# Patient Record
Sex: Female | Born: 1965 | Race: White | Hispanic: No | Marital: Married | Smoking: Never smoker
Health system: Southern US, Community
[De-identification: ages and names within clinical notes are randomized; demographics above are authoritative.]

## PROBLEM LIST (undated history)

## (undated) DIAGNOSIS — I10 Essential (primary) hypertension: Secondary | ICD-10-CM

## (undated) DIAGNOSIS — D219 Benign neoplasm of connective and other soft tissue, unspecified: Secondary | ICD-10-CM

## (undated) DIAGNOSIS — N809 Endometriosis, unspecified: Secondary | ICD-10-CM

## (undated) DIAGNOSIS — R011 Cardiac murmur, unspecified: Secondary | ICD-10-CM

## (undated) DIAGNOSIS — Z9221 Personal history of antineoplastic chemotherapy: Secondary | ICD-10-CM

## (undated) HISTORY — DX: Benign neoplasm of connective and other soft tissue, unspecified: D21.9

## (undated) HISTORY — PX: CARDIAC CATHETERIZATION: SHX172

## (undated) HISTORY — PX: OOPHORECTOMY: SHX86

## (undated) HISTORY — PX: BARIATRIC SURGERY: SHX1103

## (undated) HISTORY — DX: Endometriosis, unspecified: N80.9

## (undated) HISTORY — PX: OTHER SURGICAL HISTORY: SHX169

## (undated) HISTORY — PX: ABDOMINAL HYSTERECTOMY: SHX81

## (undated) HISTORY — PX: KNEE SURGERY: SHX244

## (undated) HISTORY — PX: TUBAL LIGATION: SHX77

---

## 2003-12-09 ENCOUNTER — Ambulatory Visit: Payer: Self-pay

## 2004-01-27 ENCOUNTER — Ambulatory Visit: Payer: Self-pay

## 2006-02-25 DIAGNOSIS — I219 Acute myocardial infarction, unspecified: Secondary | ICD-10-CM

## 2006-02-25 HISTORY — DX: Acute myocardial infarction, unspecified: I21.9

## 2006-03-15 ENCOUNTER — Other Ambulatory Visit: Payer: Self-pay

## 2006-03-15 ENCOUNTER — Inpatient Hospital Stay: Payer: Self-pay | Admitting: Internal Medicine

## 2006-03-16 ENCOUNTER — Other Ambulatory Visit: Payer: Self-pay

## 2006-03-17 ENCOUNTER — Other Ambulatory Visit: Payer: Self-pay

## 2006-03-26 ENCOUNTER — Ambulatory Visit: Payer: Self-pay | Admitting: Internal Medicine

## 2006-03-27 ENCOUNTER — Ambulatory Visit: Payer: Self-pay | Admitting: Internal Medicine

## 2006-04-16 ENCOUNTER — Encounter: Payer: Self-pay | Admitting: Internal Medicine

## 2006-04-26 ENCOUNTER — Encounter: Payer: Self-pay | Admitting: Internal Medicine

## 2006-05-22 ENCOUNTER — Ambulatory Visit: Payer: Self-pay | Admitting: Internal Medicine

## 2006-05-27 ENCOUNTER — Encounter: Payer: Self-pay | Admitting: Internal Medicine

## 2006-06-26 ENCOUNTER — Encounter: Payer: Self-pay | Admitting: Internal Medicine

## 2006-09-05 ENCOUNTER — Ambulatory Visit: Payer: Self-pay | Admitting: Unknown Physician Specialty

## 2006-09-25 ENCOUNTER — Ambulatory Visit: Payer: Self-pay | Admitting: Nurse Practitioner

## 2006-12-12 ENCOUNTER — Ambulatory Visit (HOSPITAL_COMMUNITY): Admission: RE | Admit: 2006-12-12 | Discharge: 2006-12-12 | Payer: Self-pay | Admitting: Orthopedic Surgery

## 2006-12-16 ENCOUNTER — Encounter: Payer: Self-pay | Admitting: Orthopedic Surgery

## 2006-12-27 ENCOUNTER — Encounter: Payer: Self-pay | Admitting: Orthopedic Surgery

## 2007-01-26 ENCOUNTER — Encounter: Payer: Self-pay | Admitting: Orthopedic Surgery

## 2007-04-20 ENCOUNTER — Ambulatory Visit: Payer: Self-pay | Admitting: Nurse Practitioner

## 2010-07-10 NOTE — Op Note (Signed)
NAMEBRANDII, LAKEY                 ACCOUNT NO.:  [id number]   MEDICAL RECORD NO.:  [id number]          PATIENT TYPE:  AMB   LOCATION:  SDS                          FACILITY:  [facility]   PHYSICIAN:  Harvie Junior, M.D.   DATE OF BIRTH:  04/21/1965   DATE OF PROCEDURE:  12/12/2006  DATE OF DISCHARGE:  12/12/2006                               OPERATIVE REPORT   PREOPERATIVE DIAGNOSIS:  Anterior cruciate ligament tear, right knee.   POSTOPERATIVE DIAGNOSES:  1. Anterior cruciate ligament tear, right knee.  2. Lateral meniscal tear.  3. Medial shelf plica.   PROCEDURE:  1. Anterior cruciate ligament reconstruction with central one-third      patella tendon allograft.  2. Partial lateral meniscectomy.  3. Debridement of medial shelf plica.   SURGEON:  Harvie Junior, M.D.   ASSISTANT:  Marshia Ly, P.A.   ANESTHESIA:  General.   BRIEF HISTORY:  Ms. Riesen is a 45 year old female with a long history  of having an ACL tear.  She has been evaluated by an outside physician  and noted to have an ACL tear.  They talked initially about surgical  fixation; but I think because of medical issues and other things, they  ultimately elected to wait.  She was having continued problems with  giving-out of the knee and was concerned about her instability and came  to see [place].  We ultimately felt that reconstruction with allograft was  reasonable, and got her preoperative cardiac clearance.  Did take her  off of Plavix for 10 days; I felt that was reasonable and she was  brought to the operating room for these procedures.   PROCEDURE:  The patient was taken to the operating room.  After adequate  anesthesia was obtained with general anesthetic, the patient was placed  on the operating table.  The right leg was prepped and draped in the  usual sterile fashion.  Following this, routine arthroscopic examination  of the knee revealed that there was an obvious anterior cruciate  ligament tear with  complete disruption of the fibers.  The medial side  was probed at length and felt to be normal.  Medial meniscus, medial  femoral condyle were normal.  Went up in the area of the lateral side,  which showed that posterolaterally she was fine.  A lateral femoral  condyle was normal.  Anterolaterally, there was a flap meniscal tear  which was flapped into the notch, and this was debrided with a straight  biting forceps and a shaver to shave down this area.  Once this was  completed, attention was turned to the notch, where the remnant of ACL  was debrided, followed by notchplasty on the lateral femoral condyle.  Once that was completed, the tibial guide was used and placed into the  old footprint of the ACL, and we made a small incision distomedially and  dissected down to bone.  Following this, a guidewire was advanced  through the guide, into the old footprint of the ACL.  This was  overreamed with a 10-mm reamer.  A rasp was  used on the back portion of  the tunnel.  The over-the-top guide was then used, 7-mm, and a Beath  needle was advanced out at the distolateral femur.  A 25-mm hole was  drilled in this area, and care being taken to make sure there was no  problem where the posterior wall was intact after a footprint was made.  Following this, a notch was made in this hole.  And then following that,  a little piece of bone from the notch was removed and the graft was then  advanced into the knee over the Beath needle and seated well.  Once that  was completed, the femoral side was locked in with a 7 x 20 screw with a  sheath on a tibial side.  An 8 x 20 screw, no sheath, was used to get  excellent fixation on the tibial side.  Prior to locking in the tibial  side, the graft was cycled 20 times with significant pressure on the  graft.  No tendency towards anisometry and no tendency towards blocking  flexion and extension.  Once that was completed, the little, small  portion of the bone  plug which was down in the tibial tunnel was removed  and the wounds were then closed with interrupted suture.  Sterile  compressive dressing was applied, as well as a knee immobilizer.  The  patient was taken to recovery and was noted to be in satisfactory  condition.   ESTIMATED BLOOD LOSS DURING THE PROCEDURE:  Less than 25 mL.      Harvie Junior, M.D.  Electronically Signed     JLG/MEDQ  D:  12/12/2006  T:  12/13/2006  Job:  [id number]

## 2010-12-05 LAB — I-STAT 8, (EC8 V) (CONVERTED LAB)
Acid-Base Excess: 1
Bicarbonate: 23.5
Glucose, Bld: 95
HCT: 40
Hemoglobin: 13.6
Potassium: 3.8
pH, Ven: 7.502 — ABNORMAL HIGH

## 2010-12-05 LAB — DIFFERENTIAL
Basophils Absolute: 0
Basophils Relative: 0
Eosinophils Relative: 3
Lymphs Abs: 1.6
Monocytes Absolute: 0.7
Monocytes Relative: 11
Neutro Abs: 3.5
Neutrophils Relative %: 59

## 2010-12-05 LAB — CBC
HCT: 41.4
Hemoglobin: 13.7
Platelets: 311
RBC: 4.42
WBC: 5.8

## 2010-12-05 LAB — PROTIME-INR: Prothrombin Time: 12.5

## 2010-12-05 LAB — COMPREHENSIVE METABOLIC PANEL
AST: 33
Albumin: 3.8
Alkaline Phosphatase: 61
Calcium: 9.1
Chloride: 104
Sodium: 138
Total Protein: 6.3

## 2011-07-01 ENCOUNTER — Ambulatory Visit: Payer: Self-pay | Admitting: General Practice

## 2011-10-15 ENCOUNTER — Ambulatory Visit: Payer: Self-pay | Admitting: Nurse Practitioner

## 2013-07-24 ENCOUNTER — Emergency Department (HOSPITAL_COMMUNITY)
Admission: EM | Admit: 2013-07-24 | Discharge: 2013-07-24 | Disposition: A | Discharge status: Home / Self Care | Payer: BC Managed Care – PPO | Source: Home / Self Care | Attending: Family Medicine | Admitting: Family Medicine

## 2013-07-24 ENCOUNTER — Encounter (HOSPITAL_COMMUNITY): Payer: Self-pay | Admitting: Emergency Medicine

## 2013-07-24 ENCOUNTER — Emergency Department (INDEPENDENT_AMBULATORY_CARE_PROVIDER_SITE_OTHER): Payer: BC Managed Care – PPO

## 2013-07-24 DIAGNOSIS — S82899A Other fracture of unspecified lower leg, initial encounter for closed fracture: Secondary | ICD-10-CM

## 2013-07-24 DIAGNOSIS — X500XXA Overexertion from strenuous movement or load, initial encounter: Secondary | ICD-10-CM

## 2013-07-24 DIAGNOSIS — Y92009 Unspecified place in unspecified non-institutional (private) residence as the place of occurrence of the external cause: Secondary | ICD-10-CM

## 2013-07-24 DIAGNOSIS — S82839A Other fracture of upper and lower end of unspecified fibula, initial encounter for closed fracture: Secondary | ICD-10-CM

## 2013-07-24 DIAGNOSIS — M25571 Pain in right ankle and joints of right foot: Secondary | ICD-10-CM

## 2013-07-24 DIAGNOSIS — M25579 Pain in unspecified ankle and joints of unspecified foot: Secondary | ICD-10-CM

## 2013-07-24 HISTORY — DX: Essential (primary) hypertension: I10

## 2013-07-24 MED ORDER — HYDROCODONE-ACETAMINOPHEN 5-325 MG PO TABS: 1.0000 | ORAL_TABLET | ORAL | Status: AC | PRN

## 2013-07-24 NOTE — Progress Notes (Signed)
Orthopedic Tech Progress Note Patient Details:  Ashley Shannon [birth year] [id number]  Ortho Devices Type of Ortho Device: Ace wrap;Post (short leg) splint Ortho Device/Splint Location: RLE Ortho Device/Splint Interventions: Ordered;Application   Braulio Bosch 07/24/2013, 6:33 PM

## 2013-07-24 NOTE — ED Notes (Signed)
Reports injury right ankle.  States rolled right ankle and heard a pop.  Incident happened today around 12 p.m.  Unable to bear weight.  No otc meds taken.

## 2013-07-24 NOTE — Discharge Instructions (Signed)
Ankle Fracture A fracture is a break in the bone. A cast or splint is used to protect and keep your injured bone from moving.  HOME CARE INSTRUCTIONS   Use your crutches as directed.  To lessen the swelling, keep the injured leg elevated while sitting or lying down.  Apply ice to the injury for 15-20 minutes, 03-04 times per day while awake for 2 days. Put the ice in a plastic bag and place a thin towel between the bag of ice and your cast.  If you have a plaster or fiberglass cast:  Do not try to scratch the skin under the cast using sharp or pointed objects.  Check the skin around the cast every day. You may put lotion on any red or sore areas.  Keep your cast dry and clean.  If you have a plaster splint:  Wear the splint as directed.  You may loosen the elastic around the splint if your toes become numb, tingle, or turn cold or blue.  Do not put pressure on any part of your cast or splint; it may break. Rest your cast only on a pillow the first 24 hours until it is fully hardened.  Your cast or splint can be protected during bathing with a plastic bag. Do not lower the cast or splint into water.  Take medications as directed by your caregiver. Only take over-the-counter or prescription medicines for pain, discomfort, or fever as directed by your caregiver.  Do not drive a vehicle until your caregiver specifically tells you it is safe to do so.  If your caregiver has given you a follow-up appointment, it is very important to keep that appointment. Not keeping the appointment could result in a chronic or permanent injury, pain, and disability. If there is any problem keeping the appointment, you must call back to this facility for assistance. SEEK IMMEDIATE MEDICAL CARE IF:   Your cast gets damaged or breaks.  You have continued severe pain or more swelling than you did before the cast was put on.  Your skin or toenails below the injury turn blue or gray, or feel cold or  numb.  There is a bad smell or new stains and/or purulent (pus like) drainage coming from under the cast. If you do not have a window in your cast for observing the wound, a discharge or minor bleeding may show up as a stain on the outside of your cast. Report these findings to your caregiver. MAKE SURE YOU:   Understand these instructions.  Will watch your condition.  Will get help right away if you are not doing well or get worse. Document Released: 02/09/2000 Document Revised: 05/06/2011 Document Reviewed: 09/10/2012 [facility] Patient Information 2014 [place], [place].   Ice, elevate, Motrin for mild pain, Norco for moderate pain. Call Monday for an Orthopedic f/u with Dr. Sharol Given. Information above. Touch down weight bearing only.

## 2013-07-24 NOTE — ED Provider Notes (Signed)
CSN: [id number]     Arrival date & time 07/24/13  1717 History   First MD Initiated Contact with Patient 07/24/13 1729     Chief Complaint  Patient presents with  . Ankle Injury   (Consider location/radiation/quality/duration/timing/severity/associated sxs/prior Treatment) HPI Comments: [name] presents today with right ankle pain. She was in her yard today and was stepping down from a stair, as she stepped down she rolled her ankle laterally and heard a "pop". She has been unable to bear weight. Immediate swelling and pain. Foot and knee are sore but with full ROM.   Patient is a 48 y.o. female presenting with lower extremity injury. The history is provided by the patient.  Ankle Injury    Past Medical History  Diagnosis Date  . Hypertension    History reviewed. No pertinent past surgical history. History reviewed. No pertinent family history. History  Substance Use Topics  . Smoking status: Never Smoker   . Smokeless tobacco: Not on file  . Alcohol Use: No   OB History   Grav Para Term Preterm Abortions TAB SAB Ect Mult Living                 Review of Systems  All other systems reviewed and are negative.   Allergies  Phenergan  Home Medications   Prior to Admission medications   Medication Sig Start Date End Date Taking? Authorizing Provider  amLODipine (NORVASC) 5 MG tablet Take 5 mg by mouth daily.   Yes Historical Provider, MD  aspirin EC 81 MG tablet Take 81 mg by mouth daily.   Yes Historical Provider, MD  HYDROcodone-acetaminophen (NORCO/VICODIN) 5-325 MG per tablet Take 1 tablet by mouth every 4 (four) hours as needed for moderate pain. 07/24/13   Bjorn Pippin, PA-C   BP 115/73  Pulse 70  Temp(Src) 98.5 F (36.9 C) (Oral)  Resp 16  SpO2 100% Physical Exam  Nursing note and vitals reviewed. Constitutional: She appears well-developed and well-nourished.  In wheelchair  HENT:  Head: Normocephalic and atraumatic.  Pulmonary/Chest: Effort  normal.  Musculoskeletal:       Right ankle: She exhibits decreased range of motion, swelling and ecchymosis. She exhibits no deformity, no laceration and normal pulse. Tenderness. Lateral malleolus tenderness found. No medial malleolus, no head of 5th metatarsal and no proximal fibula tenderness found. Achilles tendon normal.  Moderate swelling lateral malleolus. Pain to light palpation. Decreased ROM. Sensation intact. DP and PT pulses intact.     ED Course  Procedures (including critical care time) Labs Review Labs Reviewed - No data to display  Imaging Review Dg Ankle Complete Right  07/24/2013   CLINICAL DATA:  Fall earlier today with pain and swelling laterally  EXAM: RIGHT ANKLE - COMPLETE 3+ VIEW  COMPARISON:  None.  FINDINGS: Moderate lateral soft tissue swelling. The mortise is intact. Nondisplaced fractures through the tip of the lateral malleolus. Small ankle joint effusion. Incidental note of a small heel spur.  IMPRESSION: Nondisplaced fracture tip of lateral malleolus   Electronically Signed   By: Skipper Cliche M.D.   On: 07/24/2013 17:57     MDM   1. Fracture of fibula, distal   2. Ankle pain, right    Non-displaced. Splint, ice, elevation and analgesics. Short leg posterior splint. F/U with Dr. Sharol Given for further recommendations.     Bjorn Pippin, PA-C 07/24/13 Vernelle Emerald

## 2013-07-25 NOTE — ED Provider Notes (Signed)
Medical screening examination/treatment/procedure(s) were performed by non-physician practitioner and as supervising physician I was immediately available for consultation/collaboration.  Philipp Deputy, M.D.  Harden Mo, MD 07/25/13 [phone]

## 2017-07-22 ENCOUNTER — Other Ambulatory Visit: Payer: Self-pay | Admitting: Nurse Practitioner

## 2017-07-22 DIAGNOSIS — Z1231 Encounter for screening mammogram for malignant neoplasm of breast: Secondary | ICD-10-CM

## 2019-01-14 ENCOUNTER — Other Ambulatory Visit (HOSPITAL_COMMUNITY)
Admission: RE | Admit: 2019-01-14 | Discharge: 2019-01-14 | Disposition: A | Discharge status: Home / Self Care | Payer: BC Managed Care – PPO | Source: Ambulatory Visit | Attending: Obstetrics & Gynecology | Admitting: Obstetrics & Gynecology

## 2019-01-14 ENCOUNTER — Other Ambulatory Visit: Payer: Self-pay

## 2019-01-14 ENCOUNTER — Encounter: Payer: Self-pay | Admitting: Obstetrics & Gynecology

## 2019-01-14 ENCOUNTER — Ambulatory Visit (INDEPENDENT_AMBULATORY_CARE_PROVIDER_SITE_OTHER): Payer: BC Managed Care – PPO | Admitting: Obstetrics & Gynecology

## 2019-01-14 ENCOUNTER — Telehealth: Payer: Self-pay | Admitting: Gastroenterology

## 2019-01-14 VITALS — BP 120/80 | Ht 70.0 in | Wt 269.0 lb

## 2019-01-14 DIAGNOSIS — N950 Postmenopausal bleeding: Secondary | ICD-10-CM

## 2019-01-14 DIAGNOSIS — Z01419 Encounter for gynecological examination (general) (routine) without abnormal findings: Secondary | ICD-10-CM

## 2019-01-14 DIAGNOSIS — Z1231 Encounter for screening mammogram for malignant neoplasm of breast: Secondary | ICD-10-CM

## 2019-01-14 DIAGNOSIS — Z124 Encounter for screening for malignant neoplasm of cervix: Secondary | ICD-10-CM | POA: Diagnosis not present

## 2019-01-14 DIAGNOSIS — Z1211 Encounter for screening for malignant neoplasm of colon: Secondary | ICD-10-CM

## 2019-01-14 NOTE — Progress Notes (Signed)
HPI:      [name] is a 53 y.o. [phone] who LMP was No LMP recorded. (Menstrual status: Perimenopausal)., she presents today for her annual examination. The patient has no complaints today. The patient is sexually active. Her last pap: approximate date 2017 and was normal and last mammogram: approximate date 2017 and was normal. The patient does perform self breast exams.  There is no notable family history of breast or ovarian cancer in her family.  The patient has regular exercise: yes.  The patient denies current symptoms of depression.    She has had recent gastric bypass surgery (10/2018).  Some abdominal pressure and bladder pressure since surgery.  For the last 2 weeks she has had pink spotting after voiding noted on tissue. No period for 7 years, also has had hot flashes in past but is done w them.  Has had GSI and min nocturia for years.  GYN History: Contraception: tubal ligation  Dr Laurey Morale delivered her 2 sons and performed her BTL.  PMHx: Past Medical History:  Diagnosis Date  . Endometriosis   . Fibroids   . Hypertension    Past Surgical History:  Procedure Laterality Date  . BARIATRIC SURGERY    . TUBAL LIGATION    . wisdom teeth     Family History  Problem Relation Age of Onset  . Cervical cancer Sister   . Brain cancer Paternal Aunt   . Liver cancer Maternal Grandmother    Social History   Tobacco Use  . Smoking status: Never Smoker  . Smokeless tobacco: Never Used  Substance Use Topics  . Alcohol use: No  . Drug use: No    Current Outpatient Medications:  .  amLODipine (NORVASC) 5 MG tablet, Take 5 mg by mouth daily., Disp: , Rfl:  .  aspirin EC 81 MG tablet, Take 81 mg by mouth daily., Disp: , Rfl:  .  co-enzyme Q-10 30 MG capsule, Take 30 mg by mouth 3 (three) times daily., Disp: , Rfl:  .  omeprazole (PRILOSEC) 20 MG capsule, Take by mouth., Disp: , Rfl:  .  HYDROcodone-acetaminophen (NORCO/VICODIN) 5-325 MG per tablet, Take 1 tablet by  mouth every 4 (four) hours as needed for moderate pain. (Patient not taking: Reported on 01/14/2019), Disp: 20 tablet, Rfl: 0 Allergies: Phenergan [promethazine hcl]  Review of Systems  Constitutional: Positive for malaise/fatigue and weight loss (50 lbs since surgery). Negative for chills and fever.  HENT: Negative for congestion, sinus pain and sore throat.   Eyes: Negative for blurred vision and pain.  Respiratory: Negative for cough and wheezing.   Cardiovascular: Negative for chest pain and leg swelling.  Gastrointestinal: Negative for abdominal pain, constipation, diarrhea, heartburn, nausea and vomiting.  Genitourinary: Positive for frequency. Negative for dysuria, hematuria and urgency.       Pressure Mild incontinence  Musculoskeletal: Negative for back pain, joint pain, myalgias and neck pain.  Skin: Negative for itching and rash.  Neurological: Negative for dizziness, tremors and weakness.  Endo/Heme/Allergies: Does not bruise/bleed easily.  Psychiatric/Behavioral: Negative for depression. The patient is not nervous/anxious and does not have insomnia.     Objective: BP 120/80   Ht 5\' 10"  (1.778 m)   Wt 269 lb (122 kg)   BMI 38.60 kg/m   Filed Weights   01/14/19 1325  Weight: 269 lb (122 kg)   Body mass index is 38.6 kg/m. Physical Exam Constitutional:      General: She is not in acute  distress.    Appearance: She is well-developed.  Genitourinary:     Pelvic exam was performed with patient supine.     Vagina, uterus and rectum normal.     No lesions in the vagina.     No vaginal bleeding.     No cervical motion tenderness, friability, lesion or polyp.     Uterus is mobile.     Uterus is not enlarged.     No uterine mass detected.    Uterus is midaxial.     No right or left adnexal mass present.     Right adnexa not tender.     Left adnexa not tender.  HENT:     Head: Normocephalic and atraumatic. No laceration.     Right Ear: Hearing normal.     Left Ear:  Hearing normal.     Mouth/Throat:     Pharynx: Uvula midline.  Eyes:     Pupils: Pupils are equal, round, and reactive to light.  Neck:     Musculoskeletal: Normal range of motion and neck supple.     Thyroid: No thyromegaly.  Cardiovascular:     Rate and Rhythm: Normal rate and regular rhythm.     Pulses:          Carotid pulses are 1+ on the right side and 1+ on the left side.    Heart sounds: No murmur. No friction rub. No gallop.      Comments: No bruits Pulmonary:     Effort: Pulmonary effort is normal. No respiratory distress.     Breath sounds: Normal breath sounds. No wheezing.  Chest:     Breasts:        Right: No mass, skin change or tenderness.        Left: No mass, skin change or tenderness.  Abdominal:     General: Bowel sounds are normal. There is no distension.     Palpations: Abdomen is soft.     Tenderness: There is no abdominal tenderness. There is no rebound.     Comments: Obesity, subcutaneous tenderness and confluence like mass in central abdomen w mobility, not apparantly connected to pelvis or even deep intra-abdominal cavity  Musculoskeletal: Normal range of motion.  Neurological:     Mental Status: She is alert and oriented to person, place, and time.     Cranial Nerves: No cranial nerve deficit.  Skin:    General: Skin is warm and dry.  Psychiatric:        Judgment: Judgment normal.  Vitals signs reviewed.    Assessment:  ANNUAL EXAM 1. Women's annual routine gynecological examination   2. Encounter for screening mammogram for malignant neoplasm of breast   3. Screen for colon cancer   4. Screening for cervical cancer   5. Postmenopausal bleeding      Screening Plan:            1.  Cervical Screening-  Pap smear done today  2. Breast screening- Exam annually and mammogram>40 planned [facility]  3. Colonoscopy every 10 years, Hemoccult testing - after age 78[facility]  4. Labs managed by PCP  5. Counseling for contraception:  bilateral tubal ligation   6. Postmenopausal bleeding - May be related to recent rapid weight loss - US PELVIC COMPLETE WITH TRANSVAGINAL; Future      F/U  Return in about 1 day (around 01/15/2019) for Follow up w GYN [place].  Barnett Applebaum, MD, [facility], [facility] 01/14/2019  2:06 PM

## 2019-01-14 NOTE — Telephone Encounter (Signed)
Pt  Left vm to schedule a colonoscopy she is returning someone's call

## 2019-01-14 NOTE — Patient Instructions (Signed)
PAP every three years Mammogram every year    Call [phone] to schedule at [facility] Colonoscopy every 10 years Labs yearly (with PCP)   Pelvic Ultrasound A transvaginal ultrasound, also called an endovaginal ultrasound, is a test that uses sound waves to take pictures of the female genital tract. The pictures are taken with a device, called a transducer, that is placed in the vagina. This test may be done to:  Check for problems with your pregnancy.  Check your developing baby.  Check for anything abnormal in the uterus or ovaries.  Find out why you have pelvic pain or bleeding. Tell a health care provider about:  Any allergies you have.  All medicines you are taking, including vitamins, herbs, eye drops, creams, and over-the-counter medicines.  Any blood disorders you have.  Any surgeries you have had.  Any medical conditions you have.  Whether you are pregnant or may be pregnant.  Whether you are having your menstrual period. What are the risks? This is a safe procedure. There are no known risks or complications of having this test. What happens before the procedure? This procedure needs to be done when your bladder is empty. Follow your health care provider's instructions about drinking fluids and emptying your bladder before the test. What happens during the procedure?   You will empty your bladder before the procedure.  You will undress from the waist down.  You will lie down on an exam table, with your knees bent and your feet in foot holders.  A health care provider will cover the transducer with a sterile cover.  A gel will be put on the transducer. The gel helps transmit the sound waves and prevents irritation of your vagina.  The technician will insert the transducer into your vagina to get images. These will be displayed on a monitor that looks like a small television screen.  The transducer will be removed when the procedure is complete. The  procedure may vary among health care providers and hospitals. What happens after the procedure?  It is up to you to get the results of your procedure. Ask your health care provider, or the department that is doing the procedure, when your results will be ready.  Keep all follow-up visits as told by your health care provider. This is important. Summary  A transvaginal ultrasound, also called an endovaginal ultrasound, is a test that uses sound waves to take pictures of the female genital tract.  This is a safe procedure. There are no known risks associated with this test.  The procedure needs to be done when your bladder is empty. Follow your health care provider's instructions about drinking fluids and emptying your bladder before the test.  During the procedure, you will undress from the waist down and lie down on an exam table. A technician will insert a transducer into your vagina to obtain images.  Ask your health care provider, or the department that is doing the procedure, when your results will be ready. This information is not intended to replace advice given to you by your health care provider. Make sure you discuss any questions you have with your health care provider. Document Released: 01/24/2004 Document Revised: 09/24/2017 Document Reviewed: 09/24/2017 Elsevier Patient Education  2020 [employer].

## 2019-01-15 ENCOUNTER — Other Ambulatory Visit: Payer: Self-pay

## 2019-01-15 DIAGNOSIS — Z1211 Encounter for screening for malignant neoplasm of colon: Secondary | ICD-10-CM

## 2019-01-15 NOTE — Telephone Encounter (Signed)
Gastroenterology Pre-Procedure Review  Request Date: Thursday 02/04/19 Requesting Physician: Dr. Allen Norris  PATIENT REVIEW QUESTIONS: The patient responded to the following health history questions as indicated:    1. Are you having any GI issues? no 2. Do you have a personal history of Polyps? no 3. Do you have a family history of Colon Cancer or Polyps? no 4. Diabetes Mellitus? no 5. Joint replacements in the past 12 months?no 6. Major health problems in the past 3 months?Gastric Bypass September 17th, 2020 7. Any artificial heart valves, MVP, or defibrillator?no    MEDICATIONS & ALLERGIES:    Patient reports the following regarding taking any anticoagulation/antiplatelet therapy:   Plavix, Coumadin, Eliquis, Xarelto, Lovenox, Pradaxa, Brilinta, or Effient? no Aspirin? yes (81 mg daily)  Patient confirms/reports the following medications:  Current Outpatient Medications  Medication Sig Dispense Refill  . amLODipine (NORVASC) 5 MG tablet Take 5 mg by mouth daily.    Marland Kitchen aspirin EC 81 MG tablet Take 81 mg by mouth daily.    Marland Kitchen co-enzyme Q-10 30 MG capsule Take 30 mg by mouth 3 (three) times daily.    Marland Kitchen HYDROcodone-acetaminophen (NORCO/VICODIN) 5-325 MG per tablet Take 1 tablet by mouth every 4 (four) hours as needed for moderate pain. (Patient not taking: Reported on 01/14/2019) 20 tablet 0  . omeprazole (PRILOSEC) 20 MG capsule Take by mouth.     No current facility-administered medications for this visit.     Patient confirms/reports the following allergies:  Allergies  Allergen Reactions  . Phenergan [Promethazine Hcl]     No orders of the defined types were placed in this encounter.   AUTHORIZATION INFORMATION Primary Insurance: 1D#: Group #:  Secondary Insurance: 1D#: Group #:  SCHEDULE INFORMATION: Date: Thursday 02/04/19 Time: Location:[facility]

## 2019-01-18 LAB — CYTOLOGY - PAP
Comment: NEGATIVE
Diagnosis: NEGATIVE
High risk HPV: NEGATIVE

## 2019-01-26 DIAGNOSIS — C569 Malignant neoplasm of unspecified ovary: Secondary | ICD-10-CM

## 2019-01-26 DIAGNOSIS — C801 Malignant (primary) neoplasm, unspecified: Secondary | ICD-10-CM

## 2019-01-26 HISTORY — DX: Malignant (primary) neoplasm, unspecified: C80.1

## 2019-01-26 HISTORY — DX: Malignant neoplasm of unspecified ovary: C56.9

## 2019-01-27 ENCOUNTER — Telehealth: Payer: Self-pay

## 2019-01-27 NOTE — Telephone Encounter (Signed)
Patients call has been returned. She has been advised to stop the bariatric vitamin, however she said she is unable to.  Her bariatric surgeon had informed her that she could not stop it. She said she will call again to see if she can stop it 3 days prior, and will bring the capsule in with her when she goes for her colonoscopy.  Thanks [employer]

## 2019-02-01 ENCOUNTER — Other Ambulatory Visit
Admission: RE | Admit: 2019-02-01 | Discharge: 2019-02-01 | Disposition: A | Discharge status: Home / Self Care | Payer: BC Managed Care – PPO | Source: Ambulatory Visit | Attending: Gastroenterology | Admitting: Gastroenterology

## 2019-02-01 ENCOUNTER — Other Ambulatory Visit: Payer: Self-pay

## 2019-02-01 DIAGNOSIS — Z20828 Contact with and (suspected) exposure to other viral communicable diseases: Secondary | ICD-10-CM | POA: Insufficient documentation

## 2019-02-01 DIAGNOSIS — Z01812 Encounter for preprocedural laboratory examination: Secondary | ICD-10-CM | POA: Diagnosis present

## 2019-02-02 LAB — SARS CORONAVIRUS 2 (TAT 6-24 HRS): SARS Coronavirus 2: NEGATIVE

## 2019-02-04 ENCOUNTER — Ambulatory Visit
Admission: RE | Admit: 2019-02-04 | Discharge: 2019-02-04 | Disposition: A | Discharge status: Home / Self Care | Payer: BC Managed Care – PPO | Attending: Gastroenterology | Admitting: Gastroenterology

## 2019-02-04 ENCOUNTER — Encounter
Admission: RE | Disposition: A | Discharge status: Home / Self Care | Payer: Self-pay | Source: Home / Self Care | Attending: Gastroenterology

## 2019-02-04 ENCOUNTER — Encounter: Payer: Self-pay | Admitting: Gastroenterology

## 2019-02-04 ENCOUNTER — Other Ambulatory Visit: Payer: Self-pay

## 2019-02-04 ENCOUNTER — Ambulatory Visit: Payer: BC Managed Care – PPO | Admitting: Certified Registered"

## 2019-02-04 DIAGNOSIS — I10 Essential (primary) hypertension: Secondary | ICD-10-CM | POA: Insufficient documentation

## 2019-02-04 DIAGNOSIS — Z79899 Other long term (current) drug therapy: Secondary | ICD-10-CM | POA: Insufficient documentation

## 2019-02-04 DIAGNOSIS — K635 Polyp of colon: Secondary | ICD-10-CM

## 2019-02-04 DIAGNOSIS — K640 First degree hemorrhoids: Secondary | ICD-10-CM | POA: Diagnosis not present

## 2019-02-04 DIAGNOSIS — K5730 Diverticulosis of large intestine without perforation or abscess without bleeding: Secondary | ICD-10-CM | POA: Insufficient documentation

## 2019-02-04 DIAGNOSIS — D122 Benign neoplasm of ascending colon: Secondary | ICD-10-CM | POA: Diagnosis not present

## 2019-02-04 DIAGNOSIS — Z888 Allergy status to other drugs, medicaments and biological substances status: Secondary | ICD-10-CM | POA: Diagnosis not present

## 2019-02-04 DIAGNOSIS — Z1211 Encounter for screening for malignant neoplasm of colon: Secondary | ICD-10-CM

## 2019-02-04 DIAGNOSIS — Z7982 Long term (current) use of aspirin: Secondary | ICD-10-CM | POA: Diagnosis not present

## 2019-02-04 DIAGNOSIS — I252 Old myocardial infarction: Secondary | ICD-10-CM | POA: Insufficient documentation

## 2019-02-04 HISTORY — DX: Cardiac murmur, unspecified: R01.1

## 2019-02-04 HISTORY — PX: COLONOSCOPY WITH PROPOFOL: SHX5780

## 2019-02-04 SURGERY — COLONOSCOPY WITH PROPOFOL
Anesthesia: General

## 2019-02-04 MED ORDER — GLYCOPYRROLATE 0.2 MG/ML IJ SOLN
INTRAMUSCULAR | Status: DC | PRN
  Administered 2019-02-04: 22.96 ug via INTRAVENOUS

## 2019-02-04 MED ORDER — SODIUM CHLORIDE 0.9 % IV SOLN
INTRAVENOUS | Status: DC
  Administered 2019-02-04: 1000 mL via INTRAVENOUS

## 2019-02-04 MED ORDER — GLYCOPYRROLATE 0.2 MG/ML IJ SOLN
INTRAMUSCULAR | Status: AC
  Filled 2019-02-04: qty 1

## 2019-02-04 MED ORDER — PROPOFOL 500 MG/50ML IV EMUL
INTRAVENOUS | Status: DC | PRN
  Administered 2019-02-04: 70 mg via INTRAVENOUS
  Administered 2019-02-04: 100 ug/kg/min via INTRAVENOUS

## 2019-02-04 MED ORDER — PROPOFOL 10 MG/ML IV BOLUS
INTRAVENOUS | Status: AC
  Filled 2019-02-04: qty 20

## 2019-02-04 MED ORDER — LIDOCAINE HCL (PF) 2 % IJ SOLN
INTRAMUSCULAR | Status: AC
  Filled 2019-02-04: qty 10

## 2019-02-04 MED ORDER — PROPOFOL 500 MG/50ML IV EMUL
INTRAVENOUS | Status: AC
  Filled 2019-02-04: qty 50

## 2019-02-04 NOTE — H&P (Signed)
Lucilla Lame, MD [address] [place], [place] [address] Phone: [phone] Fax : [phone]  Primary Care Physician:  Renee Rival, NP Primary Gastroenterologist:  Dr. Allen Norris  Pre-Procedure History & Physical: HPI:  Ashley Shannon is a 53 y.o. female is here for a screening colonoscopy.   Past Medical History:  Diagnosis Date  . Endometriosis   . Fibroids   . Heart attack ([place]) 2008  . Heart murmur   . Hypertension     Past Surgical History:  Procedure Laterality Date  . BARIATRIC SURGERY    . CARDIAC CATHETERIZATION     stents  . KNEE SURGERY     meniscus and acl  . TUBAL LIGATION    . wisdom teeth      Prior to Admission medications   Medication Sig Start Date End Date Taking? Authorizing Provider  amLODipine (NORVASC) 5 MG tablet Take 5 mg by mouth daily.    [provider]  aspirin EC 81 MG tablet Take 81 mg by mouth daily.    [provider]  co-enzyme Q-10 30 MG capsule Take 30 mg by mouth 3 (three) times daily.    [provider]  HYDROcodone-acetaminophen (NORCO/VICODIN) 5-325 MG per tablet Take 1 tablet by mouth every 4 (four) hours as needed for moderate pain. Patient not taking: Reported on 01/14/2019 07/24/13   Bjorn Pippin, PA-C  omeprazole (PRILOSEC) 20 MG capsule Take by mouth. 06/22/18   [provider]    Allergies as of 01/15/2019 - Review Complete 01/14/2019  Allergen Reaction Noted  . Phenergan [promethazine hcl]  07/24/2013    Family History  Problem Relation Age of Onset  . Cervical cancer Sister   . Brain cancer Paternal Aunt   . Liver cancer Maternal Grandmother     Social History   Socioeconomic History  . Marital status: Married    Spouse name: Not on file  . Number of children: Not on file  . Years of education: Not on file  . Highest education level: Not on file  Occupational History  . Not on file  Tobacco Use  . Smoking status: Never Smoker  . Smokeless  tobacco: Never Used  Substance and Sexual Activity  . Alcohol use: No  . Drug use: No  . Sexual activity: Yes  Other Topics Concern  . Not on file  Social History Narrative  . Not on file   Social Determinants of Health   Financial Resource Strain:   . Difficulty of Paying Living Expenses: Not on file  Food Insecurity:   . Worried About Charity fundraiser in the Last Year: Not on file  . Ran Out of Food in the Last Year: Not on file  Transportation Needs:   . Lack of Transportation (Medical): Not on file  . Lack of Transportation (Non-Medical): Not on file  Physical Activity:   . Days of Exercise per Week: Not on file  . Minutes of Exercise per Session: Not on file  Stress:   . Feeling of Stress : Not on file  Social Connections:   . Frequency of Communication with Friends and Family: Not on file  . Frequency of Social Gatherings with Friends and Family: Not on file  . Attends Religious Services: Not on file  . Active Member of Clubs or Organizations: Not on file  . Attends Archivist Meetings: Not on file  . Marital Status: Not on file  Intimate Partner Violence:   .  Fear of Current or Ex-Partner: Not on file  . Emotionally Abused: Not on file  . Physically Abused: Not on file  . Sexually Abused: Not on file    Review of Systems: See HPI, otherwise negative ROS  Physical Exam: BP (!) 155/128   Pulse 78   Temp (!) 96.8 F (36 C)   Resp 18   Ht 5\' 10"  (1.778 m)   Wt 114.8 kg   SpO2 100%   BMI 36.30 kg/m  General:   Alert,  pleasant and cooperative in NAD Head:  Normocephalic and atraumatic. Neck:  Supple; no masses or thyromegaly. Lungs:  Clear throughout to auscultation.    Heart:  Regular rate and rhythm. Abdomen:  Soft, nontender and nondistended. Normal bowel sounds, without guarding, and without rebound.   Neurologic:  Alert and  oriented x4;  grossly normal neurologically.  Impression/Plan: Ashley Shannon is now here to undergo a screening  colonoscopy.  Risks, benefits, and alternatives regarding colonoscopy have been reviewed with the patient.  Questions have been answered.  All parties agreeable.

## 2019-02-04 NOTE — Anesthesia Post-op Follow-up Note (Signed)
Anesthesia QCDR form completed.        

## 2019-02-04 NOTE — Op Note (Signed)
[facility] Gastroenterology Patient Name: Ashley Shannon Procedure Date: 02/04/2019 7:55 AM MRN: [id number] Account #: [id number] Date of Birth: 03/30/1965 Admit Type: Outpatient Age: 53 Room: [facility] ENDO ROOM 4 Gender: Female Note Status: Finalized Procedure:             Colonoscopy Indications:           Screening for colorectal malignant neoplasm Providers:             Lucilla Lame MD, MD Referring MD:          Renee Rival (Referring MD) Medicines:             Propofol per Anesthesia Complications:         No immediate complications. Procedure:             Pre-Anesthesia Assessment:                        - Prior to the procedure, a History and Physical was                         performed, and patient medications and allergies were                         reviewed. The patient's tolerance of previous                         anesthesia was also reviewed. The risks and benefits                         of the procedure and the sedation options and risks                         were discussed with the patient. All questions were                         answered, and informed consent was obtained. Prior                         Anticoagulants: The patient has taken no previous                         anticoagulant or antiplatelet agents. ASA Grade                         Assessment: II - A patient with mild systemic disease.                         After reviewing the risks and benefits, the patient                         was deemed in satisfactory condition to undergo the                         procedure.                        After obtaining informed consent, the colonoscope was  passed under direct vision. Throughout the procedure,                         the patient's blood pressure, pulse, and oxygen                         saturations were monitored continuously. The                         Colonoscope was introduced through the  anus and                         advanced to the the cecum, identified by appendiceal                         orifice and ileocecal valve. The colonoscopy was                         performed without difficulty. The patient tolerated                         the procedure well. The quality of the bowel                         preparation was excellent. Findings:      The perianal and digital rectal examinations were normal.      Two sessile polyps were found in the ascending colon. The polyps were 3       to 5 mm in size. These polyps were removed with a cold biopsy forceps.       Resection and retrieval were complete.      A few small-mouthed diverticula were found in the sigmoid colon.      Non-bleeding internal hemorrhoids were found during retroflexion. The       hemorrhoids were Grade I (internal hemorrhoids that do not prolapse). Impression:            - Two 3 to 5 mm polyps in the ascending colon, removed                         with a cold biopsy forceps. Resected and retrieved.                        - Diverticulosis in the sigmoid colon.                        - Non-bleeding internal hemorrhoids. Recommendation:        - Discharge patient to home.                        - Resume previous diet.                        - Continue present medications. Procedure Code(s):     --- Professional ---                        [phone], Colonoscopy, flexible; with biopsy, single or  multiple Diagnosis Code(s):     --- Professional ---                        Z12.11, Encounter for screening for malignant neoplasm                         of colon                        K63.5, Polyp of colon CPT copyright 2019 American Medical Association. All rights reserved. The codes documented in this report are preliminary and upon coder review may  be revised to meet current compliance requirements. Lucilla Lame MD, MD 02/04/2019 8:25:05 AM This report has been signed  electronically. Number of Addenda: 0 Note Initiated On: 02/04/2019 7:55 AM Scope Withdrawal Time: 0 hours 7 minutes 26 seconds  Total Procedure Duration: 0 hours 12 minutes 33 seconds  Estimated Blood Loss:  Estimated blood loss: none.      [facility]

## 2019-02-04 NOTE — Anesthesia Preprocedure Evaluation (Signed)
Anesthesia Evaluation  Patient identified by MRN, date of birth, ID band Patient awake    Reviewed: Allergy & Precautions, H&P , NPO status , Patient's Chart, lab work & pertinent test results, reviewed documented beta blocker date and time   Airway Mallampati: II   Neck ROM: full    Dental  (+) Poor Dentition   Pulmonary neg pulmonary ROS,    Pulmonary exam normal        Cardiovascular Exercise Tolerance: Poor hypertension, On Medications + Past MI  Normal cardiovascular exam+ Valvular Problems/Murmurs  Rhythm:regular Rate:Normal     Neuro/Psych negative neurological ROS  negative psych ROS   GI/Hepatic negative GI ROS, Neg liver ROS,   Endo/Other  negative endocrine ROS  Renal/GU negative Renal ROS  negative genitourinary   Musculoskeletal   Abdominal   Peds  Hematology negative hematology ROS (+)   Anesthesia Other Findings Past Medical History: No date: Endometriosis No date: Fibroids 2008: Heart attack ([place]) No date: Heart murmur No date: Hypertension Past Surgical History: No date: BARIATRIC SURGERY No date: CARDIAC CATHETERIZATION     Comment:  stents No date: KNEE SURGERY     Comment:  meniscus and acl No date: TUBAL LIGATION No date: wisdom teeth BMI    Body Mass Index: 36.30 kg/m     Reproductive/Obstetrics negative OB ROS                             Anesthesia Physical Anesthesia Plan  ASA: III  Anesthesia Plan: General   Post-op Pain Management:    Induction:   PONV Risk Score and Plan:   Airway Management Planned:   Additional Equipment:   Intra-op Plan:   Post-operative Plan:   Informed Consent: I have reviewed the patients History and Physical, chart, labs and discussed the procedure including the risks, benefits and alternatives for the proposed anesthesia with the patient or authorized representative who has indicated his/her understanding and  acceptance.     Dental Advisory Given  Plan Discussed with: CRNA  Anesthesia Plan Comments:         Anesthesia Quick Evaluation

## 2019-02-04 NOTE — Transfer of Care (Signed)
Immediate Anesthesia Transfer of Care Note  Patient: Ashley Shannon  Procedure(s) Performed: COLONOSCOPY WITH PROPOFOL (N/A )  Patient Location: PACU  Anesthesia Type:MAC  Level of Consciousness: sedated, drowsy and patient cooperative  Airway & Oxygen Therapy: Patient Spontanous Breathing  Post-op Assessment: Report given to RN and Post -op Vital signs reviewed and stable  Post vital signs: Reviewed and stable  Last Vitals:  Vitals Value Taken Time  BP    Temp    Pulse 76 02/04/19 0828  Resp 15 02/04/19 0828  SpO2 94 % 02/04/19 0828  Vitals shown include unvalidated device data.  Last Pain: There were no vitals filed for this visit.       Complications: No apparent anesthesia complications

## 2019-02-05 ENCOUNTER — Ambulatory Visit: Payer: BC Managed Care – PPO | Admitting: Obstetrics & Gynecology

## 2019-02-05 ENCOUNTER — Encounter: Payer: Self-pay | Admitting: *Deleted

## 2019-02-05 ENCOUNTER — Ambulatory Visit: Payer: BC Managed Care – PPO

## 2019-02-05 LAB — SURGICAL PATHOLOGY

## 2019-02-06 ENCOUNTER — Encounter: Payer: Self-pay | Admitting: Gastroenterology

## 2019-02-09 ENCOUNTER — Telehealth: Payer: Self-pay | Admitting: Obstetrics & Gynecology

## 2019-02-09 NOTE — Telephone Encounter (Signed)
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  Ann's # [phone]

## 2019-02-09 NOTE — Telephone Encounter (Signed)
Keep appt.  [place] necessary.

## 2019-02-09 NOTE — Telephone Encounter (Signed)
Please advise 

## 2019-02-09 NOTE — Telephone Encounter (Signed)
Patient was seen at [facility] and had CT scan done which showed 10 cm ovarian neoplasm per PA.  Patient has appointment for [place] and follow up 12/28 with [place].  Please advise if appointment needs to be sooner and overbook ok.

## 2019-02-09 NOTE — Telephone Encounter (Signed)
Do we have anything sooner ?

## 2019-02-10 NOTE — Telephone Encounter (Signed)
I have added patient to cancellation list at this time no openings.

## 2019-02-16 NOTE — Telephone Encounter (Signed)
Patient no longer is needing ultrasound and follow up.  She is having surgery with [facility].

## 2019-02-17 NOTE — Anesthesia Postprocedure Evaluation (Signed)
Anesthesia Post Note  Patient: Ashley Shannon  Procedure(s) Performed: COLONOSCOPY WITH PROPOFOL (N/A )  Patient location during evaluation: PACU Anesthesia Type: General Level of consciousness: awake and alert Pain management: pain level controlled Vital Signs Assessment: post-procedure vital signs reviewed and stable Respiratory status: spontaneous breathing, nonlabored ventilation, respiratory function stable and patient connected to nasal cannula oxygen Cardiovascular status: blood pressure returned to baseline and stable Postop Assessment: no apparent nausea or vomiting Anesthetic complications: no     Last Vitals:  Vitals:   02/04/19 0847 02/04/19 0857  BP: 99/64 107/67  Pulse: 70 60  Resp: 16 15  Temp:    SpO2: 100% 100%    Last Pain:  Vitals:   02/05/19 0740  TempSrc:   PainSc: 0-No pain                 Molli Barrows

## 2019-02-22 ENCOUNTER — Ambulatory Visit: Payer: BC Managed Care – PPO

## 2019-02-22 ENCOUNTER — Ambulatory Visit: Payer: BC Managed Care – PPO | Admitting: Obstetrics & Gynecology

## 2019-04-21 ENCOUNTER — Telehealth: Payer: Self-pay

## 2019-04-21 NOTE — Telephone Encounter (Signed)
-----   Message from Gae Dry, MD sent at 04/12/2019  1:12 PM EST ----- Regarding: MMG Received notice she has not received MMG yet as ordered at her Annual. Please check and encourage her to do this, and document conversation.

## 2019-04-21 NOTE — Telephone Encounter (Signed)
Pt states she has been diagnosed with  Ovarian cancer, having problems with her chemo, she will schedule her Mammo in 6 months.

## 2019-05-21 ENCOUNTER — Other Ambulatory Visit: Payer: Self-pay | Admitting: Obstetrics & Gynecology

## 2019-08-17 ENCOUNTER — Other Ambulatory Visit: Payer: Self-pay

## 2019-08-17 ENCOUNTER — Ambulatory Visit
Admission: RE | Admit: 2019-08-17 | Discharge: 2019-08-17 | Disposition: A | Discharge status: Home / Self Care | Payer: BC Managed Care – PPO | Source: Ambulatory Visit | Attending: Obstetrics & Gynecology | Admitting: Obstetrics & Gynecology

## 2019-08-17 DIAGNOSIS — Z1231 Encounter for screening mammogram for malignant neoplasm of breast: Secondary | ICD-10-CM | POA: Diagnosis present

## 2019-08-17 HISTORY — DX: Personal history of antineoplastic chemotherapy: Z92.21

## 2019-08-24 ENCOUNTER — Other Ambulatory Visit: Payer: Self-pay | Admitting: Obstetrics & Gynecology

## 2019-08-24 DIAGNOSIS — R928 Other abnormal and inconclusive findings on diagnostic imaging of breast: Secondary | ICD-10-CM

## 2019-08-24 DIAGNOSIS — N6489 Other specified disorders of breast: Secondary | ICD-10-CM

## 2019-08-26 ENCOUNTER — Other Ambulatory Visit: Payer: Self-pay | Admitting: Obstetrics & Gynecology

## 2019-08-26 ENCOUNTER — Ambulatory Visit
Admission: RE | Admit: 2019-08-26 | Discharge: 2019-08-26 | Disposition: A | Discharge status: Home / Self Care | Payer: BC Managed Care – PPO | Source: Ambulatory Visit | Attending: Obstetrics & Gynecology | Admitting: Obstetrics & Gynecology

## 2019-08-26 DIAGNOSIS — R928 Other abnormal and inconclusive findings on diagnostic imaging of breast: Secondary | ICD-10-CM

## 2019-08-26 DIAGNOSIS — N6489 Other specified disorders of breast: Secondary | ICD-10-CM

## 2019-08-31 ENCOUNTER — Ambulatory Visit
Admission: RE | Admit: 2019-08-31 | Discharge: 2019-08-31 | Disposition: A | Discharge status: Home / Self Care | Payer: BC Managed Care – PPO | Source: Ambulatory Visit | Attending: Obstetrics & Gynecology | Admitting: Obstetrics & Gynecology

## 2019-08-31 DIAGNOSIS — R928 Other abnormal and inconclusive findings on diagnostic imaging of breast: Secondary | ICD-10-CM

## 2019-08-31 DIAGNOSIS — N6489 Other specified disorders of breast: Secondary | ICD-10-CM

## 2019-08-31 HISTORY — PX: BREAST BIOPSY: SHX20

## 2019-09-01 LAB — SURGICAL PATHOLOGY

## 2020-07-12 ENCOUNTER — Other Ambulatory Visit: Payer: Self-pay | Admitting: Obstetrics & Gynecology

## 2020-07-12 ENCOUNTER — Other Ambulatory Visit: Payer: Self-pay | Admitting: Nurse Practitioner

## 2020-07-12 DIAGNOSIS — Z1231 Encounter for screening mammogram for malignant neoplasm of breast: Secondary | ICD-10-CM

## 2020-08-17 ENCOUNTER — Ambulatory Visit: Payer: BC Managed Care – PPO

## 2020-08-22 ENCOUNTER — Other Ambulatory Visit: Payer: Self-pay

## 2020-08-22 ENCOUNTER — Ambulatory Visit
Admission: RE | Admit: 2020-08-22 | Discharge: 2020-08-22 | Disposition: A | Discharge status: Home / Self Care | Payer: Self-pay | Source: Ambulatory Visit | Attending: Nurse Practitioner | Admitting: Nurse Practitioner

## 2020-08-22 DIAGNOSIS — Z1231 Encounter for screening mammogram for malignant neoplasm of breast: Secondary | ICD-10-CM | POA: Insufficient documentation

## 2021-05-09 IMAGING — MG DIGITAL SCREENING BILAT W/ TOMO W/ CAD
6 of 12 series · 6 of 36 positions shown · non-contrast
Comparison: Previous exam(s).

ACR Breast Density Category a: The breast tissue is almost entirely
fatty.

CLINICAL DATA: Screening.

EXAM:
DIGITAL SCREENING BILATERAL MAMMOGRAM WITH TOMO AND CAD

[L MLO synth-2D (1 of 2)]
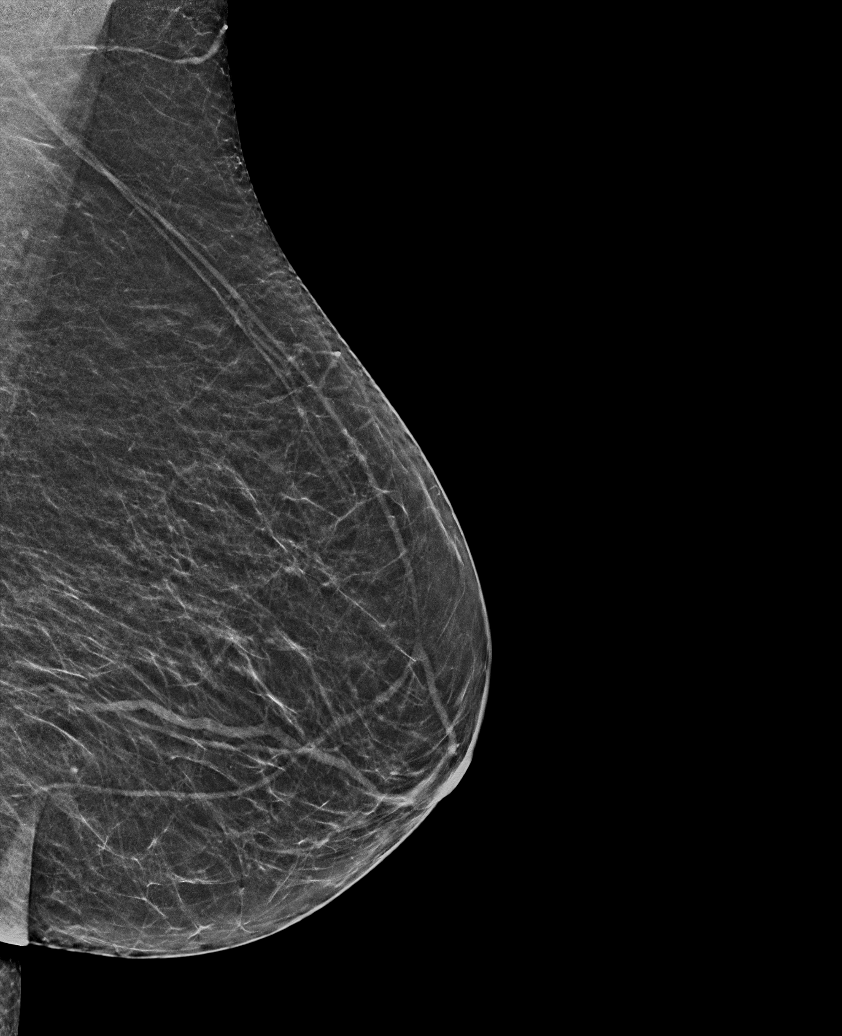

[L MLO synth-2D (2 of 2)]
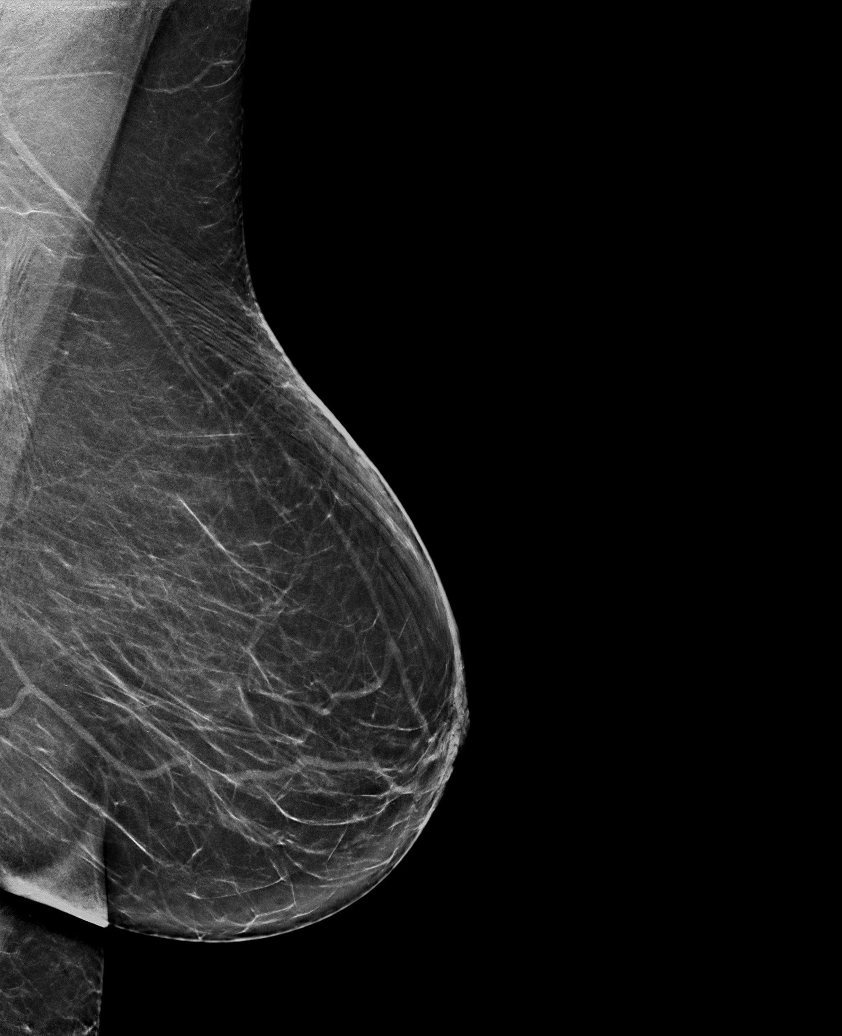

[R CC synth-2D (1 of 2)]
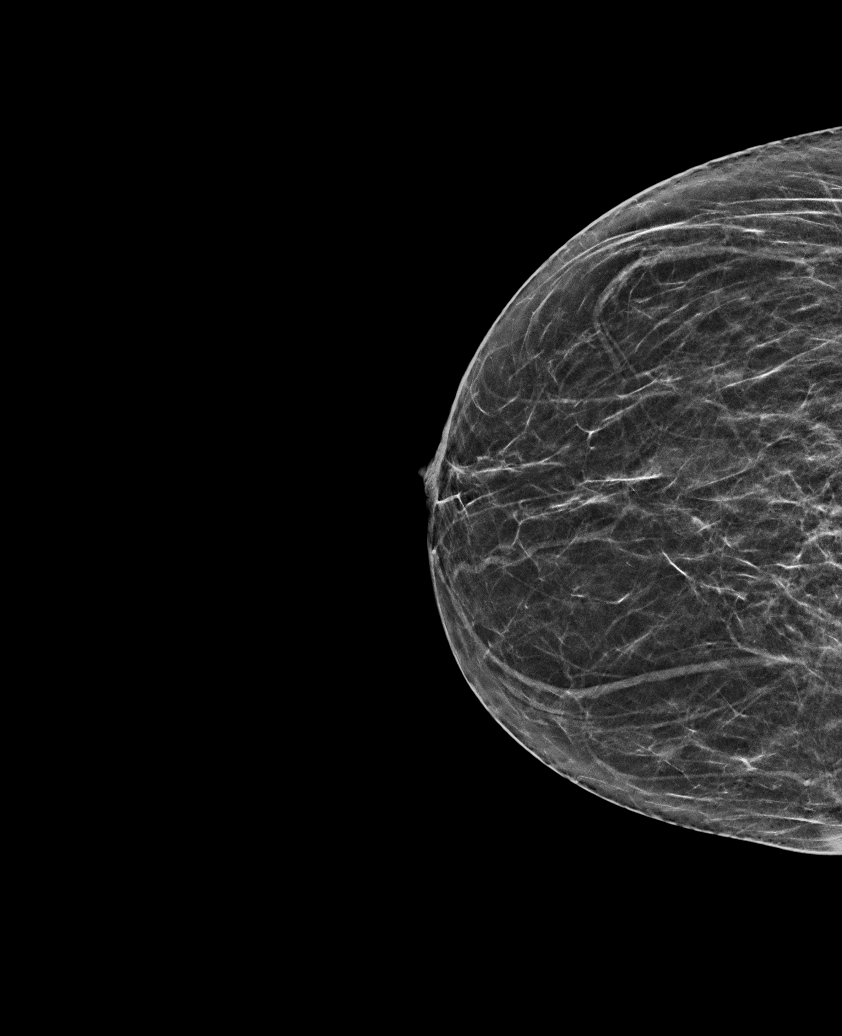

[R MLO synth-2D]
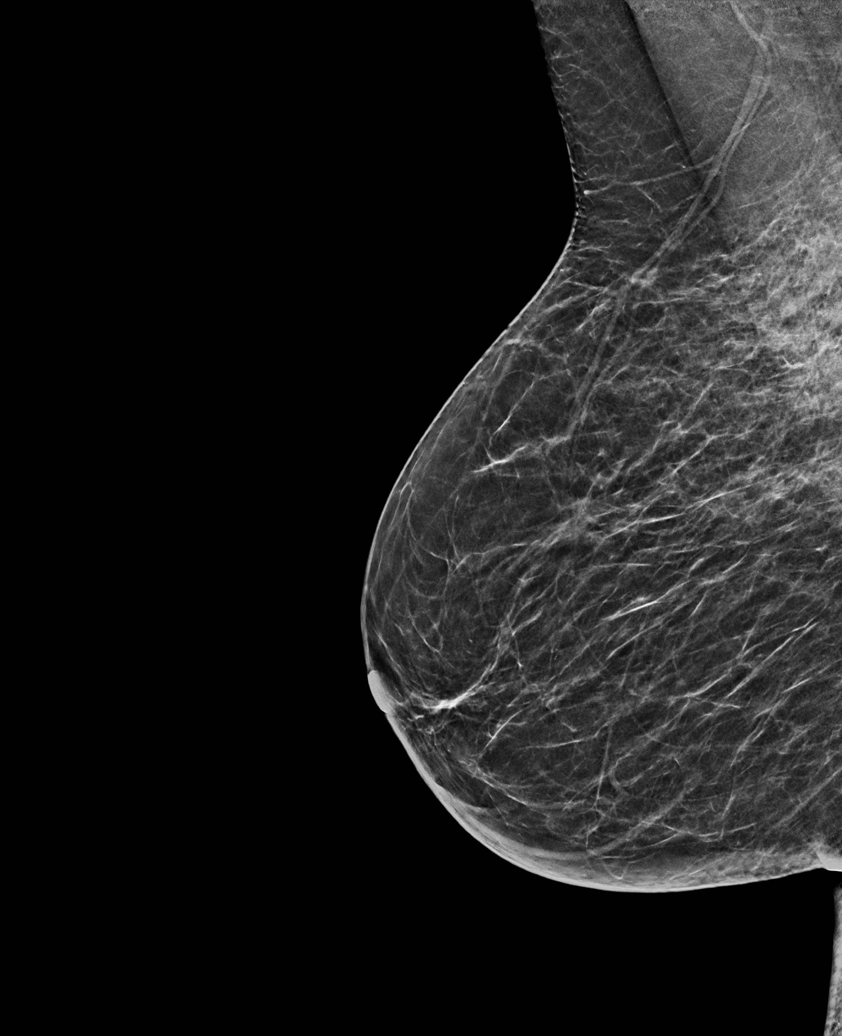

[R CC synth-2D (2 of 2)]
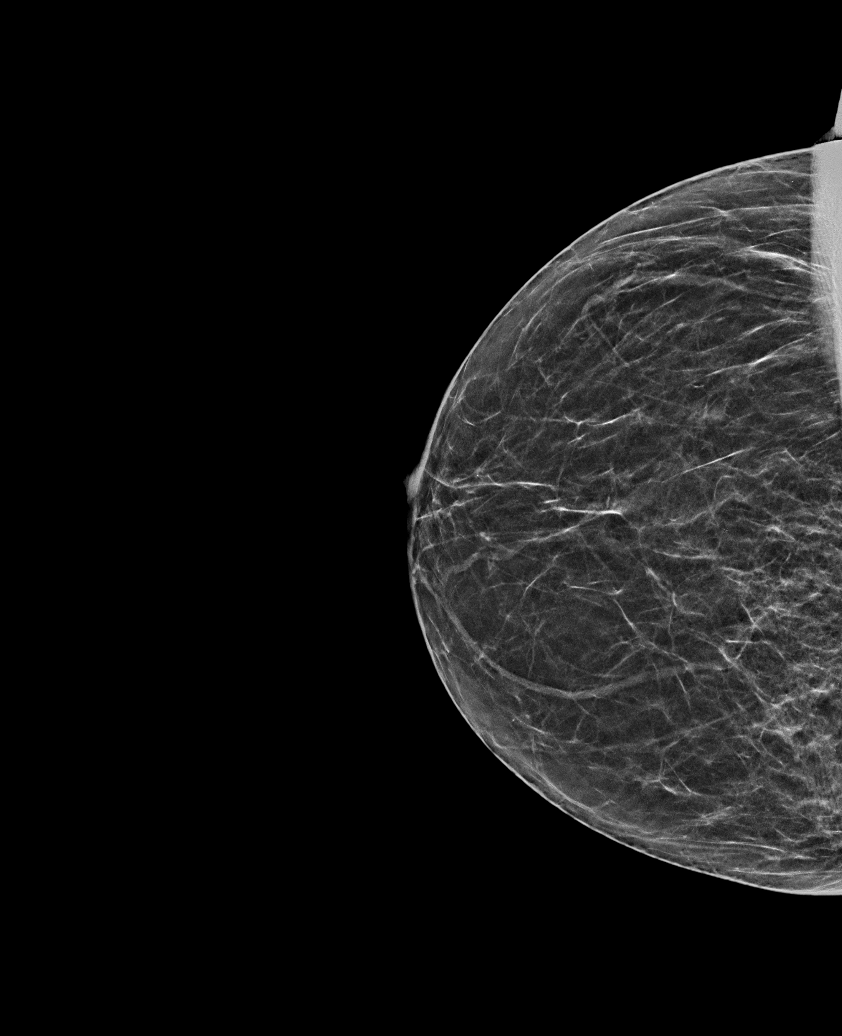

[L CC synth-2D]
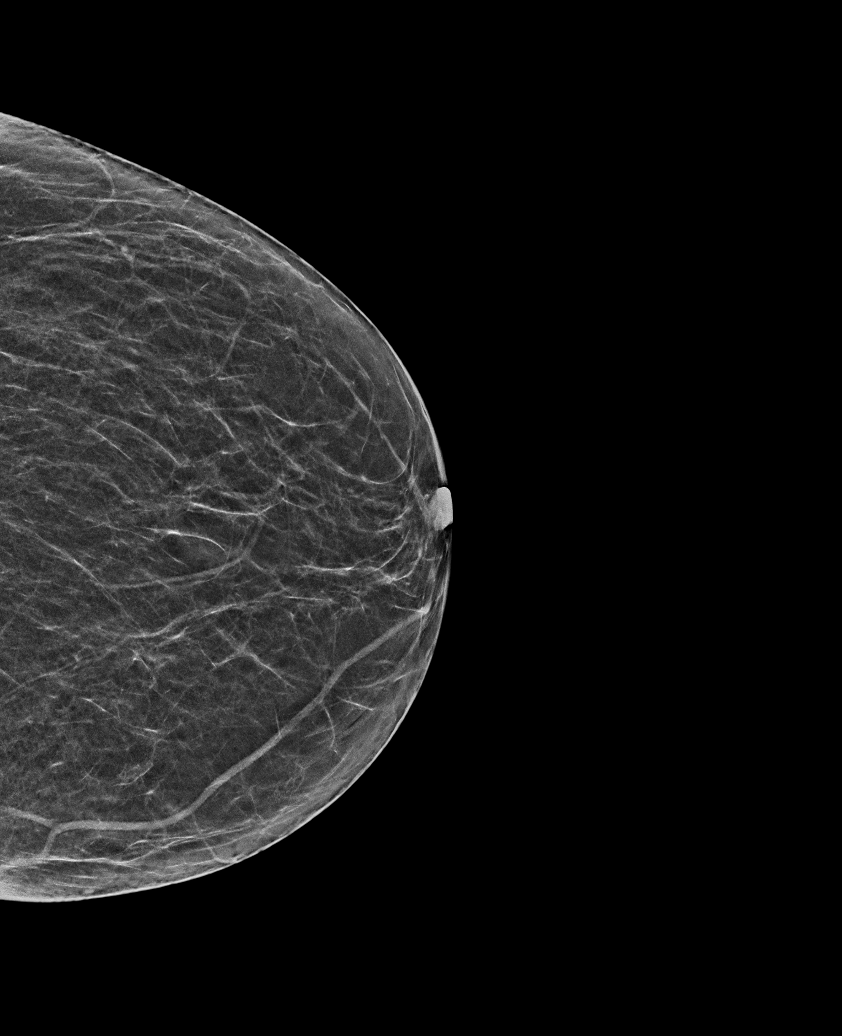

[6 of 36 positions shown; findings below may reference images not displayed]

FINDINGS: In the right breast, a possible asymmetry warrants further
evaluation. In the left breast, no findings suspicious for
malignancy. Images were processed with CAD.
IMPRESSION: Further evaluation is suggested for possible asymmetry in the right
breast.

RECOMMENDATION:
Diagnostic mammogram and possibly ultrasound of the right breast.
(Code:[id number])

The patient will be contacted regarding the findings, and additional
imaging will be scheduled.

BI-RADS CATEGORY  0: Incomplete. Need additional imaging evaluation
and/or prior mammograms for comparison.

## 2021-05-18 IMAGING — US US BREAST*R* LIMITED INC AXILLA
1 series · 5 of 5 positions shown · non-contrast
Comparison: Previous exam(s).

ACR Breast Density Category a: The breast tissue is almost entirely
fatty.

CLINICAL DATA: Patient recalled from screening for right breast
asymmetry. Note patient had a Port-A-Cath within the upper inner
right breast with extravasation of chemotherapy in the right breast
tissues. Port-A-Cath has subsequently been removed and placed on the
left side.

EXAM:
DIGITAL DIAGNOSTIC RIGHT MAMMOGRAM WITH CAD AND TOMO
ULTRASOUND RIGHT BREAST

[Series 1: us breast*right* limited inc axilla · 0.06mm/px · 5 of 5 slices shown]
[im 1/5]
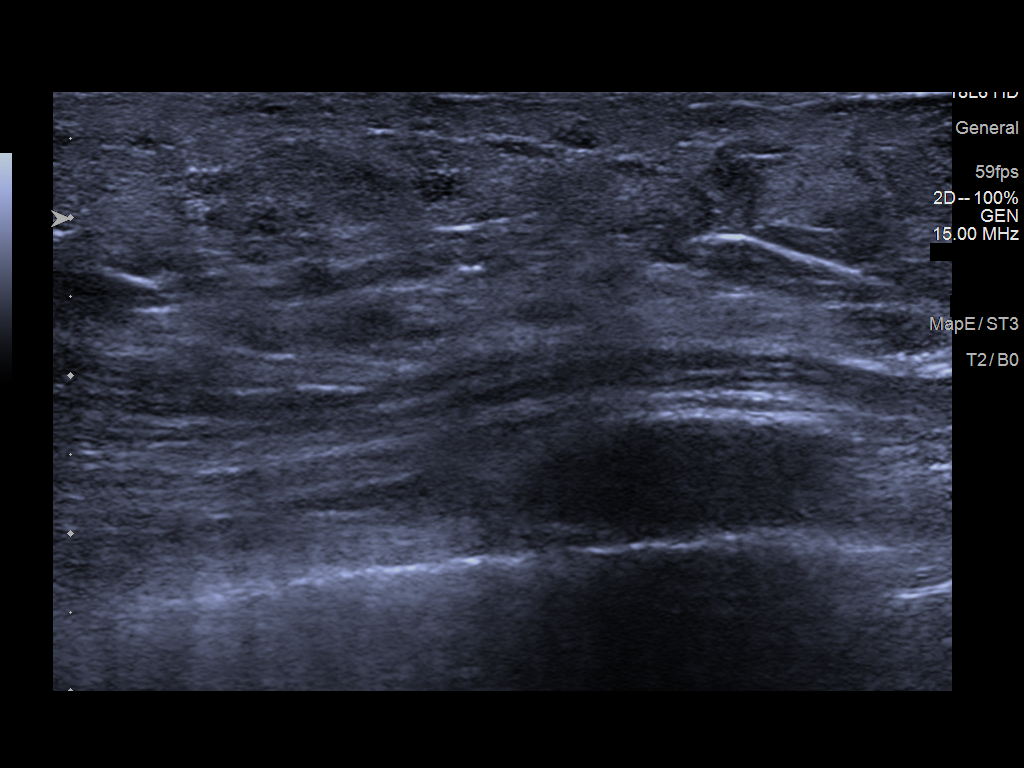
[im 2/5]
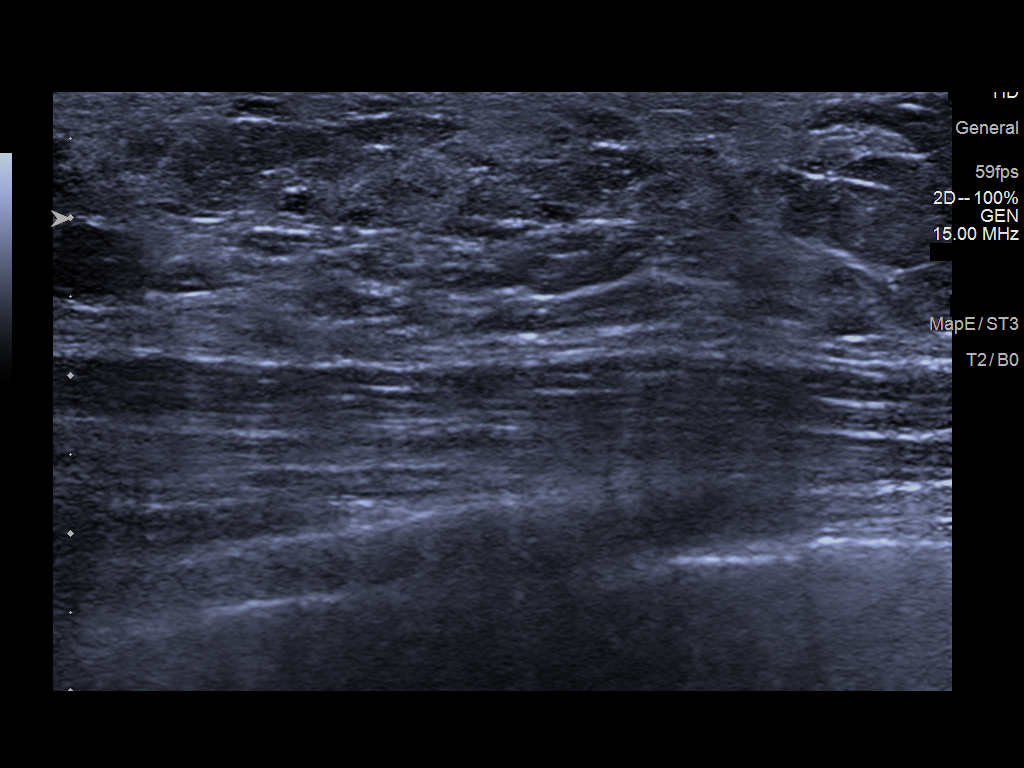
[im 3/5]
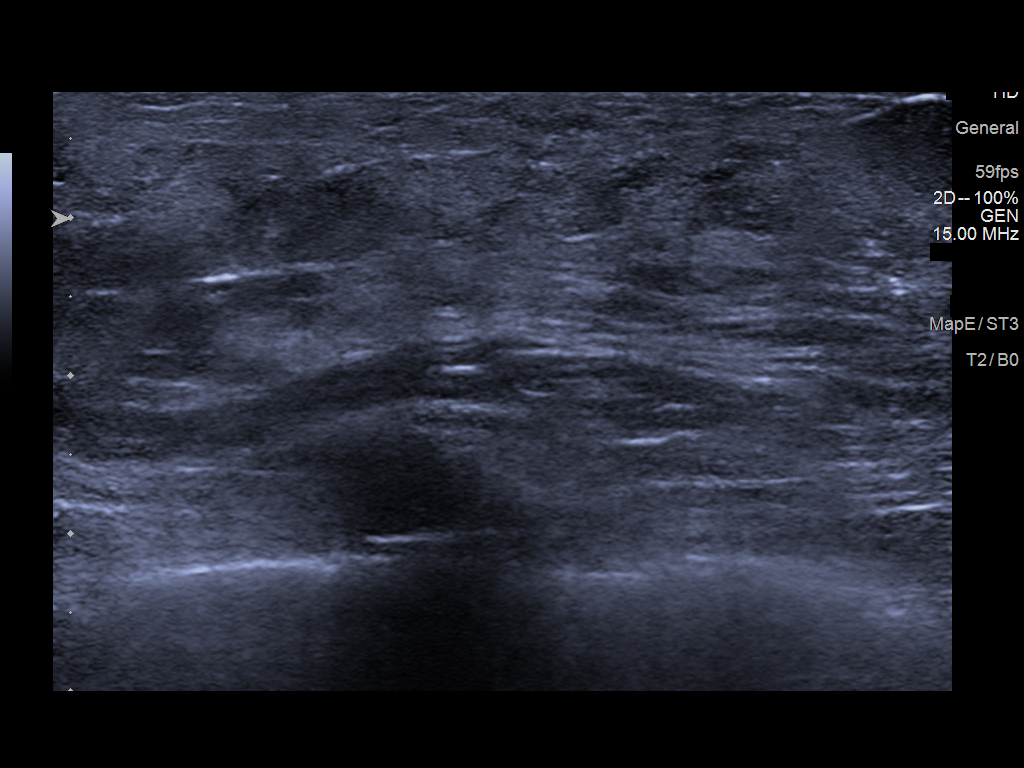
[im 4/5]
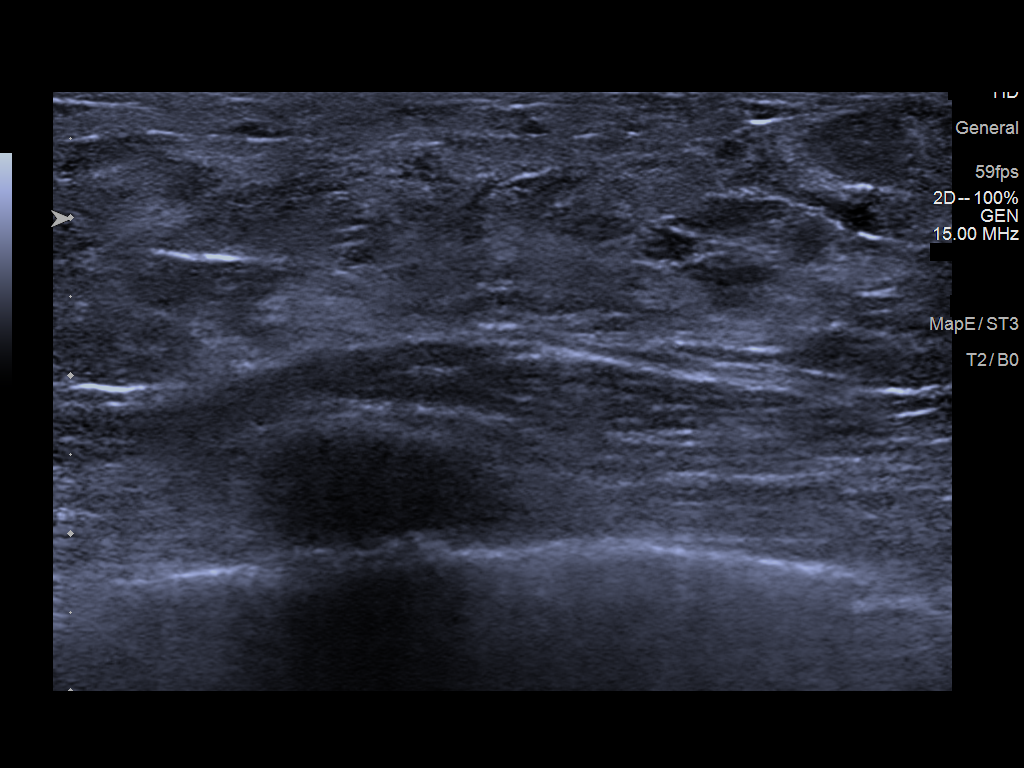
[im 5/5]
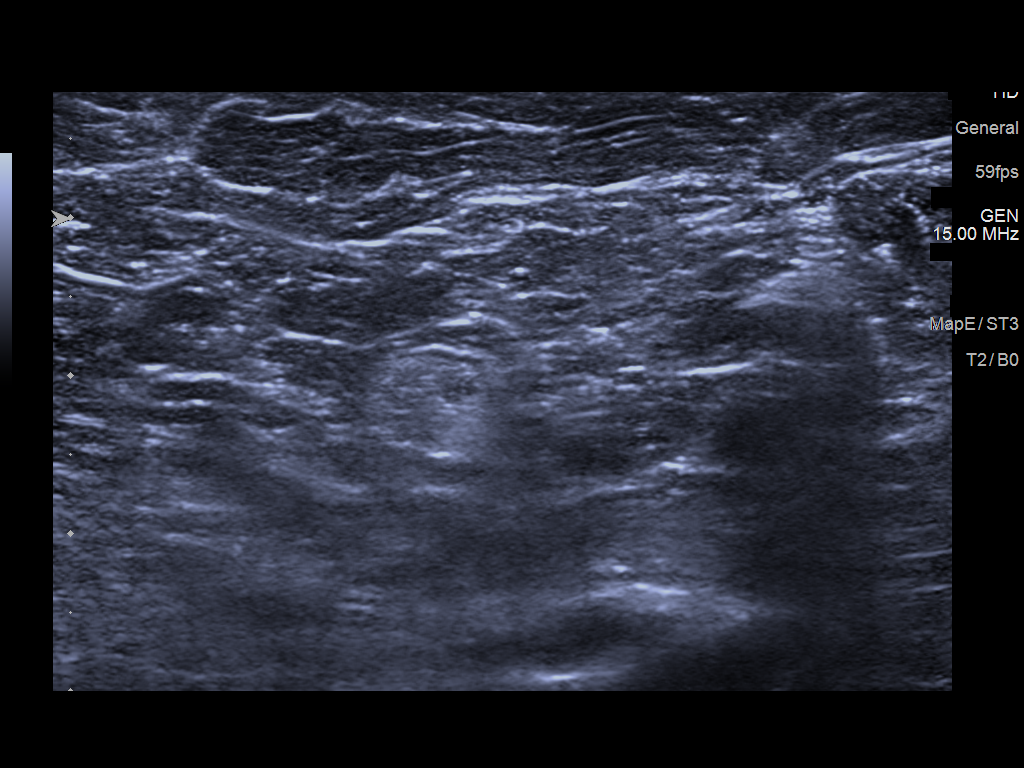

[5 of 5 positions shown; findings below may reference images not displayed]

FINDINGS: There is a persistent large vague asymmetry within the upper inner
right breast posterior depth, further evaluated with spot
compression CC and MLO tomosynthesis images.

Mammographic images were processed with CAD.

Targeted ultrasound is performed, showing a large area of mixed
echogenicity, predominantly hyperechoic within the right breast 1
o'clock position 10 cm from nipple at the site of mammographically
identified asymmetry.

No right axillary adenopathy.
IMPRESSION: There is a new vague asymmetry within the upper inner right breast
posterior depth which is near the patient's prior Port-A-Cath site.
This is likely secondary to tissue injury from the leaked
chemotherapeutic agents from the right Port-A-Cath which has
subsequently been removed. As the patient has a history of ovarian
cancer, and this is significantly changed in appearance from prior
mammogram, definitive diagnosis with biopsy/pathology is desired.

RECOMMENDATION:
Ultrasound-guided core needle biopsy of a portion of the vague area
of hyperechogenicity within the right breast 1 o'clock position
corresponding with large area of mammographic asymmetry for
definitive characterization.

I have discussed the findings and recommendations with the patient.
If applicable, a reminder letter will be sent to the patient
regarding the next appointment.

BI-RADS CATEGORY  4: Suspicious.

## 2021-05-23 IMAGING — MG US  BREAST BX W/ LOC DEV 1ST LESION IMG BX SPEC US GUIDE*R*
1 series · 8 of 8 positions shown · non-contrast
Comparison: Previous exam(s).
COMPARISON: Previous exam(s).

Addendum:
CLINICAL DATA: 54-year-old female presenting for biopsy of a right
breast asymmetry.

EXAM:
ULTRASOUND GUIDED RIGHT BREAST CORE NEEDLE BIOPSY

[Series 1: MG view · 0.06mm/px · 8 of 9 slices shown]
[im 1/9]
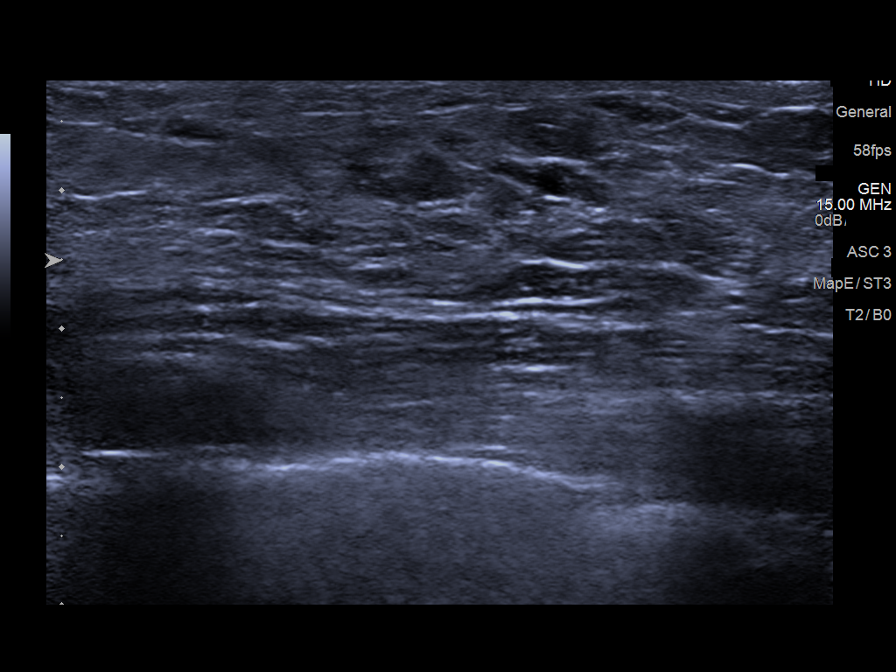
[im 2/9]
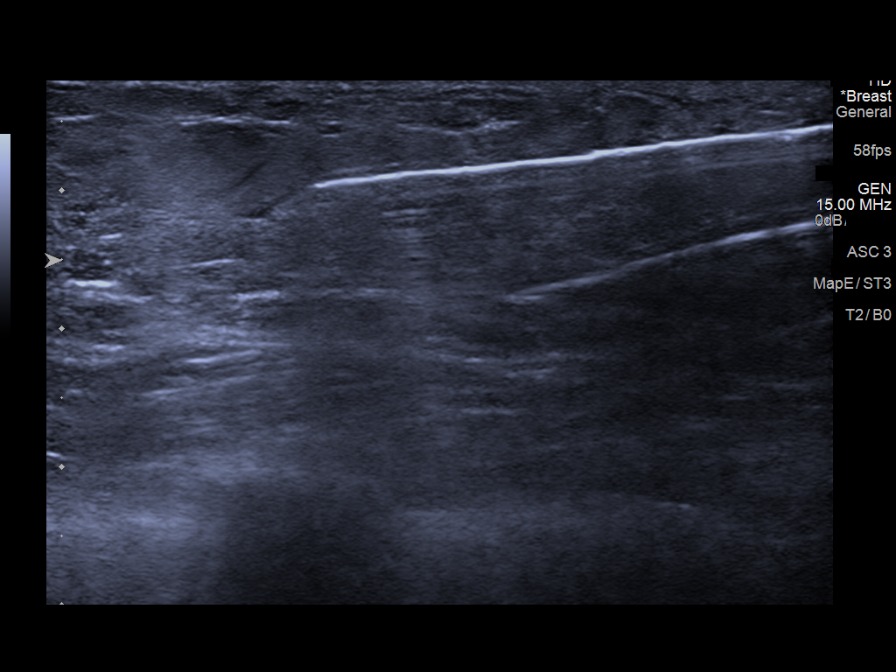
[im 3/9]
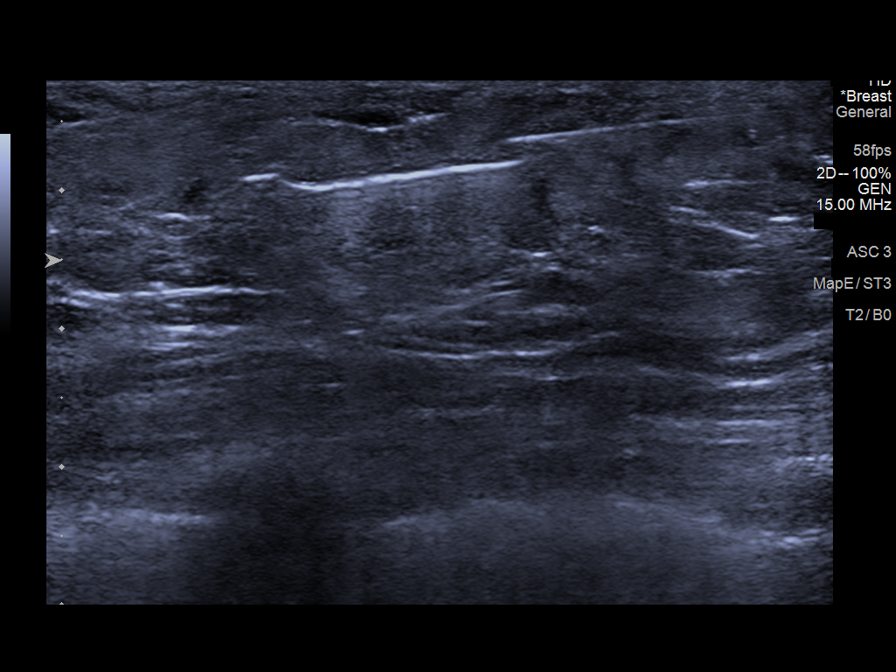
[im 4/9]
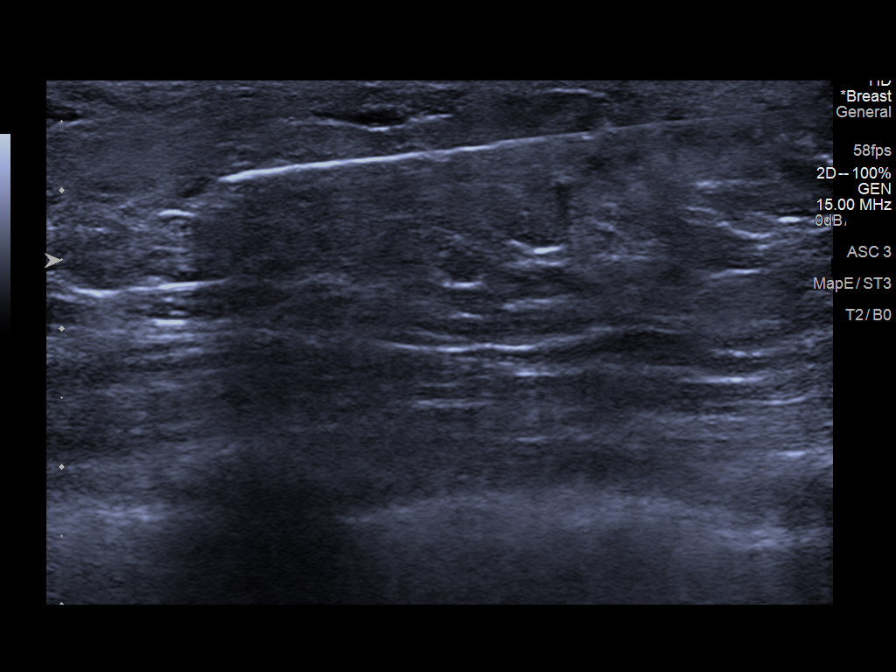
[im 5/9]
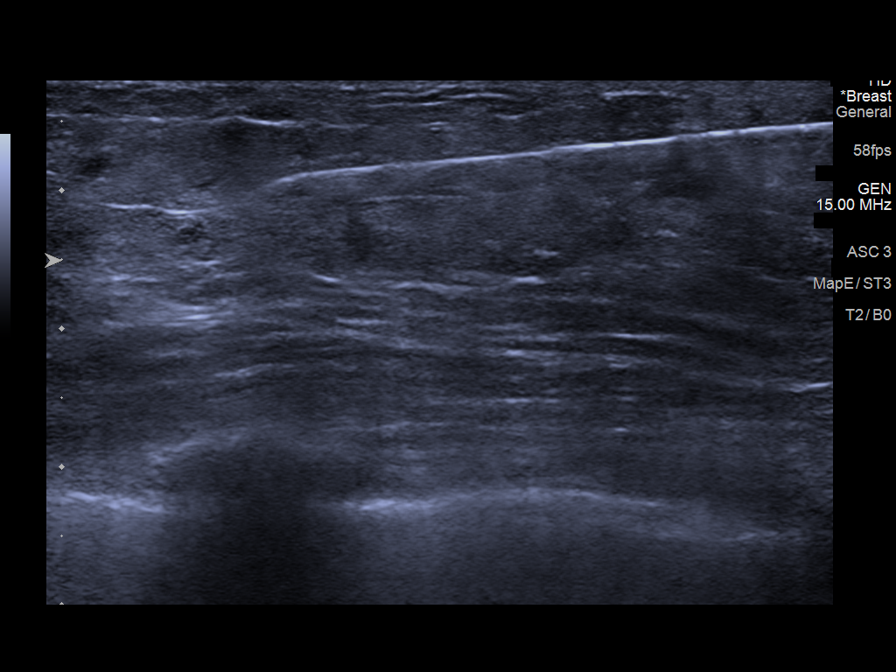
[im 6/9]
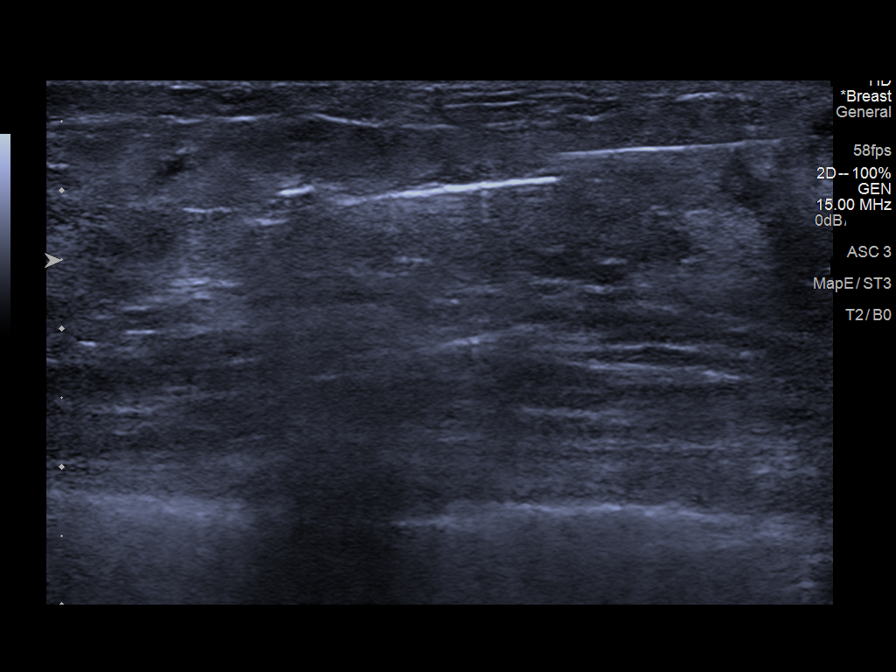
[im 7/9]
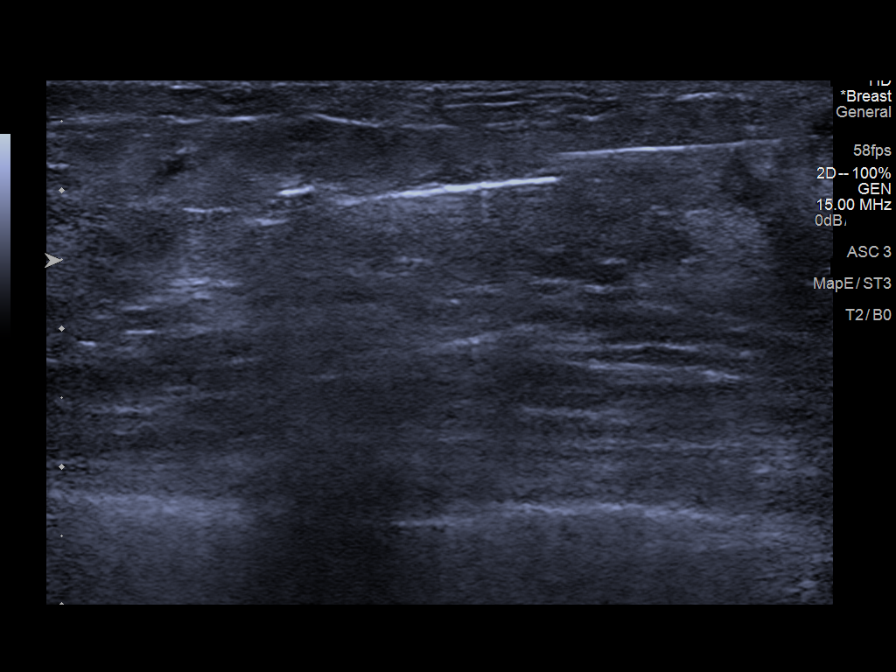
[im 9/9]
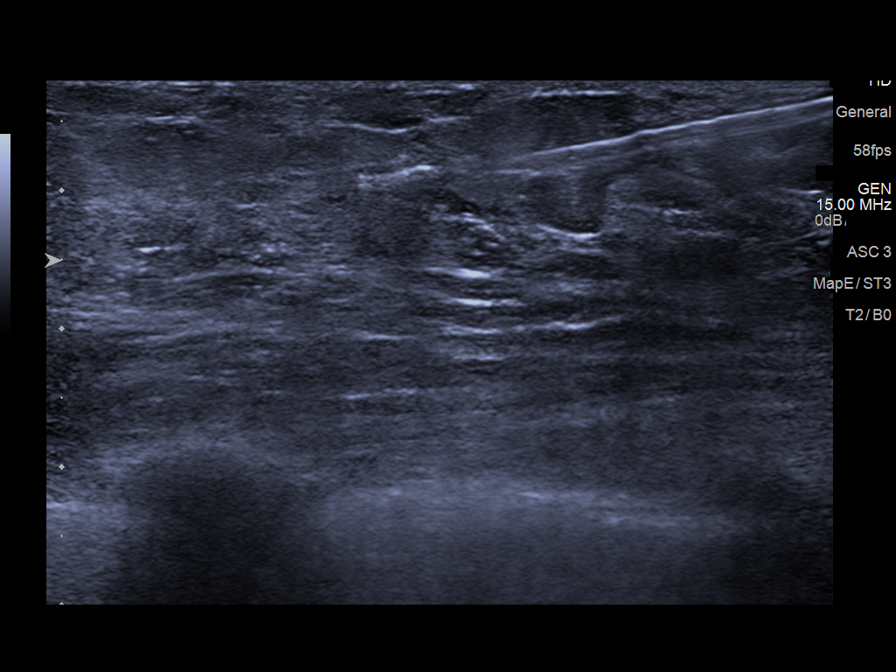

[8 of 8 positions shown; findings below may reference images not displayed]



Lesion quadrant: Upper inner quadrant

Using sterile technique and 1% Lidocaine as local anesthetic, under
direct ultrasound visualization, a 14 gauge B-Liz device was
used to perform biopsy of an asymmetry in the right breast 1 o'clock
using a inferior approach. At the conclusion of the procedure a Q
tissue marker clip was deployed into the biopsy cavity. Follow up 2
view mammogram was performed and dictated separately.
IMPRESSION: Ultrasound guided biopsy of an asymmetry in the right breast at 1
o'clock. No apparent complications.

ADDENDUM:
PATHOLOGY revealed: A. RIGHT BREAST; ULTRASOUND-GUIDED BIOPSY: -
ADIPOSE TISSUE WITH FIBROSIS AND FOCAL FAT NECROSIS. - NEGATIVE FOR
ATYPIA AND MALIGNANCY.

Pathology results are CONCORDANT with imaging findings, per Dr.
Fernie Wolde.

Pathology results and recommendations below were discussed with
patient by telephone on 09/01/2019. Patient reported biopsy site
within normal limits with slight tenderness at the site. Post biopsy
care instructions were reviewed and questions were answered. Patient
was instructed to call [HOSPITAL] if any concerns or
questions arise related to the biopsy.

Recommendation: Patient instructed to resume annual bilateral
screening mammogram due in July 2020.

Addendum by Jakelinne Danitza RN on 09/02/2019.



Lesion quadrant: Upper inner quadrant

Using sterile technique and 1% Lidocaine as local anesthetic, under
direct ultrasound visualization, a 14 gauge B-Liz device was
used to perform biopsy of an asymmetry in the right breast 1 o'clock
using a inferior approach. At the conclusion of the procedure a Q
tissue marker clip was deployed into the biopsy cavity. Follow up 2
view mammogram was performed and dictated separately.
IMPRESSION: Ultrasound guided biopsy of an asymmetry in the right breast at 1
o'clock. No apparent complications.

## 2023-11-24 ENCOUNTER — Other Ambulatory Visit: Payer: Self-pay | Admitting: Nurse Practitioner

## 2023-11-24 DIAGNOSIS — Z1231 Encounter for screening mammogram for malignant neoplasm of breast: Secondary | ICD-10-CM

## 2023-12-25 ENCOUNTER — Ambulatory Visit
Admission: RE | Admit: 2023-12-25 | Discharge: 2023-12-25 | Disposition: A | Discharge status: Home / Self Care | Payer: Self-pay | Source: Ambulatory Visit | Attending: Nurse Practitioner | Admitting: Nurse Practitioner

## 2023-12-25 DIAGNOSIS — Z1231 Encounter for screening mammogram for malignant neoplasm of breast: Secondary | ICD-10-CM | POA: Insufficient documentation
